# Patient Record
Sex: Female | Born: 1965 | Race: Black or African American | Hispanic: No | Marital: Single | Smoking: Never smoker
Health system: Southern US, Community
[De-identification: ages and names within clinical notes are randomized; demographics above are authoritative.]

## PROBLEM LIST (undated history)

## (undated) DIAGNOSIS — K649 Unspecified hemorrhoids: Secondary | ICD-10-CM

## (undated) DIAGNOSIS — M549 Dorsalgia, unspecified: Secondary | ICD-10-CM

## (undated) DIAGNOSIS — K5900 Constipation, unspecified: Secondary | ICD-10-CM

## (undated) DIAGNOSIS — N83209 Unspecified ovarian cyst, unspecified side: Secondary | ICD-10-CM

## (undated) HISTORY — DX: Unspecified ovarian cyst, unspecified side: N83.209

## (undated) HISTORY — PX: TONSILLECTOMY: SUR1361

## (undated) HISTORY — DX: Constipation, unspecified: K59.00

## (undated) HISTORY — DX: Dorsalgia, unspecified: M54.9

## (undated) HISTORY — DX: Unspecified hemorrhoids: K64.9

---

## 1998-05-28 ENCOUNTER — Other Ambulatory Visit: Admission: RE | Admit: 1998-05-28 | Discharge: 1998-05-28 | Payer: Self-pay | Admitting: Obstetrics and Gynecology

## 2000-08-17 ENCOUNTER — Other Ambulatory Visit: Admission: RE | Admit: 2000-08-17 | Discharge: 2000-08-17 | Payer: Self-pay | Admitting: Family Medicine

## 2001-07-28 ENCOUNTER — Other Ambulatory Visit: Admission: RE | Admit: 2001-07-28 | Discharge: 2001-07-28 | Payer: Self-pay | Admitting: Obstetrics and Gynecology

## 2002-04-23 ENCOUNTER — Emergency Department (HOSPITAL_COMMUNITY): Admission: EM | Admit: 2002-04-23 | Discharge: 2002-04-23 | Payer: Self-pay | Admitting: Emergency Medicine

## 2002-08-01 ENCOUNTER — Other Ambulatory Visit: Admission: RE | Admit: 2002-08-01 | Discharge: 2002-08-01 | Payer: Self-pay | Admitting: Obstetrics and Gynecology

## 2002-08-28 ENCOUNTER — Ambulatory Visit (HOSPITAL_COMMUNITY): Admission: RE | Admit: 2002-08-28 | Discharge: 2002-08-28 | Payer: Self-pay | Admitting: Obstetrics and Gynecology

## 2002-09-11 ENCOUNTER — Encounter: Payer: Self-pay | Admitting: Neurology

## 2002-09-11 ENCOUNTER — Encounter: Admission: RE | Admit: 2002-09-11 | Discharge: 2002-09-11 | Payer: Self-pay | Admitting: Neurology

## 2003-08-28 ENCOUNTER — Other Ambulatory Visit: Admission: RE | Admit: 2003-08-28 | Discharge: 2003-08-28 | Payer: Self-pay | Admitting: Obstetrics and Gynecology

## 2004-09-07 ENCOUNTER — Emergency Department (HOSPITAL_COMMUNITY): Admission: EM | Admit: 2004-09-07 | Discharge: 2004-09-07 | Payer: Self-pay | Admitting: Emergency Medicine

## 2004-09-15 ENCOUNTER — Other Ambulatory Visit: Admission: RE | Admit: 2004-09-15 | Discharge: 2004-09-15 | Payer: Self-pay | Admitting: Obstetrics and Gynecology

## 2005-09-17 ENCOUNTER — Other Ambulatory Visit: Admission: RE | Admit: 2005-09-17 | Discharge: 2005-09-17 | Payer: Self-pay | Admitting: Obstetrics and Gynecology

## 2005-09-19 ENCOUNTER — Emergency Department (HOSPITAL_COMMUNITY): Admission: EM | Admit: 2005-09-19 | Discharge: 2005-09-19 | Payer: Self-pay | Admitting: Family Medicine

## 2006-10-12 ENCOUNTER — Other Ambulatory Visit: Admission: RE | Admit: 2006-10-12 | Discharge: 2006-10-12 | Payer: Self-pay | Admitting: Obstetrics & Gynecology

## 2007-08-02 ENCOUNTER — Encounter: Admission: RE | Admit: 2007-08-02 | Discharge: 2007-08-02 | Payer: Self-pay | Admitting: Obstetrics and Gynecology

## 2007-11-03 ENCOUNTER — Other Ambulatory Visit: Admission: RE | Admit: 2007-11-03 | Discharge: 2007-11-03 | Payer: Self-pay | Admitting: Obstetrics and Gynecology

## 2008-11-05 ENCOUNTER — Other Ambulatory Visit: Admission: RE | Admit: 2008-11-05 | Discharge: 2008-11-05 | Payer: Self-pay | Admitting: Obstetrics and Gynecology

## 2009-06-12 ENCOUNTER — Emergency Department (HOSPITAL_COMMUNITY): Admission: EM | Admit: 2009-06-12 | Discharge: 2009-06-12 | Payer: Self-pay | Admitting: Emergency Medicine

## 2010-09-28 ENCOUNTER — Encounter: Payer: Self-pay | Admitting: Obstetrics and Gynecology

## 2010-12-11 LAB — POCT I-STAT, CHEM 8
Chloride: 105 mEq/L (ref 96–112)
Creatinine, Ser: 0.6 mg/dL (ref 0.4–1.2)
Glucose, Bld: 93 mg/dL (ref 70–99)
HCT: 40 % (ref 36.0–46.0)
Potassium: 4.3 mEq/L (ref 3.5–5.1)
Sodium: 140 mEq/L (ref 135–145)

## 2010-12-11 LAB — DIFFERENTIAL
Basophils Absolute: 0 10*3/uL (ref 0.0–0.1)
Basophils Relative: 0 % (ref 0–1)
Lymphocytes Relative: 23 % (ref 12–46)
Lymphs Abs: 1.7 10*3/uL (ref 0.7–4.0)
Monocytes Relative: 5 % (ref 3–12)
Neutrophils Relative %: 72 % (ref 43–77)

## 2010-12-11 LAB — GC/CHLAMYDIA PROBE AMP, GENITAL
Chlamydia, DNA Probe: NEGATIVE
GC Probe Amp, Genital: NEGATIVE

## 2010-12-11 LAB — CBC
MCHC: 32.4 g/dL (ref 30.0–36.0)
MCV: 90.8 fL (ref 78.0–100.0)
Platelets: 229 10*3/uL (ref 150–400)
RBC: 4.23 MIL/uL (ref 3.87–5.11)
WBC: 7.4 10*3/uL (ref 4.0–10.5)

## 2010-12-11 LAB — URINALYSIS, ROUTINE W REFLEX MICROSCOPIC
Bilirubin Urine: NEGATIVE
Ketones, ur: NEGATIVE mg/dL
Protein, ur: NEGATIVE mg/dL
Urobilinogen, UA: 0.2 mg/dL (ref 0.0–1.0)
pH: 8 (ref 5.0–8.0)

## 2010-12-11 LAB — URINE MICROSCOPIC-ADD ON

## 2010-12-11 LAB — WET PREP, GENITAL: Yeast Wet Prep HPF POC: NONE SEEN

## 2011-01-23 NOTE — Op Note (Signed)
NAME:  Brooke West, Brooke West                   ACCOUNT NO.:  [id number]   MEDICAL RECORD NO.:  [id number]                   PATIENT TYPE:  AMB   LOCATION:  SDC                                  FACILITY:  [facility]   PHYSICIAN:  Cynthia P. Romine, M.D.             DATE OF BIRTH:  1966-04-04   DATE OF PROCEDURE:  08/28/2002  DATE OF DISCHARGE:                                 OPERATIVE REPORT   PREOPERATIVE DIAGNOSES:  Desire attempt at permanent surgical sterilization.   POSTOPERATIVE DIAGNOSES:  Desire attempt at permanent surgical  sterilization.   PROCEDURE:  Falope ring bilateral tubal sterilization procedure.   SURGEON:  Cynthia P. Romine, M.D.   ANESTHESIA:  General endotracheal.   ESTIMATED BLOOD LOSS:  Minimal.   COMPLICATIONS:  None.   PROCEDURE:  The patient was taken to the operating room and after the  induction of adequate general endotracheal anesthesia was placed in a dorsal  lithotomy position and prepped and draped in the usual fashion.  A Hulka  uterine manipulator was placed.  The bladder was catheterized with a red  rubber catheter in-and-out.  A subumbilical incision was made and the Veress  needle was inserted into the peritoneal space.  Proper placement was tested  by noting free flow of saline through the Veress needle with a negative  aspirate and then by noting the response of a drop of saline placed at the  hub of the Veress needle with negative pressure as the abdominal wall was  elevated.  Pneumoperitoneum was created with 3 L of CO2 using the Stryker  automatic insufflator.  A disposable 10-11 mm trocar was inserted into the  peritoneal space and its proper placement noted with the laparoscope.  A  small midline suprapubic incision was made and lower trocar was inserted  under direct visualization.  The pelvis was inspected.  The tubes, ovaries,  and uterus were normal.  The tubes were freely mobile.  The right fallopian  tube was identified and traced  to its fimbriated end.  The isthmic portion  was elevated and the Falope ring was placed.  Good blanching was noted and  no bleeding was encountered.  The procedure repeated on the patient's left,  identifying the tube and tracing it to its fimbriated end.  The mid isthmic  portion was elevated and the Falope ring was placed.  Again, good blanching  was noted and no bleeding was encountered.  Photographic documentation was  taken.  Instruments were then removed from the abdomen.  Pneumoperitoneum  was allowed to escape.  The incisions were closed subcuticularly with 3-0  Vicryl.  Instruments removed from the vagina and the procedure was  terminated.  The patient tolerated it well and went in satisfactory  condition to postanesthesia recovery.  Cynthia P. Romine, M.D.    CPR/MEDQ  D:  08/28/2002  T:  08/28/2002  Job:  [id number]

## 2011-03-27 IMAGING — CR DG ABDOMEN 1V
2 series · 2 of 2 positions shown · non-contrast
Comparison: None available.

CLINICAL DATA: Right lower quadrant pain.

SINGLE VIEW ABDOMEN

[t abdomen supine (1 of 2)]
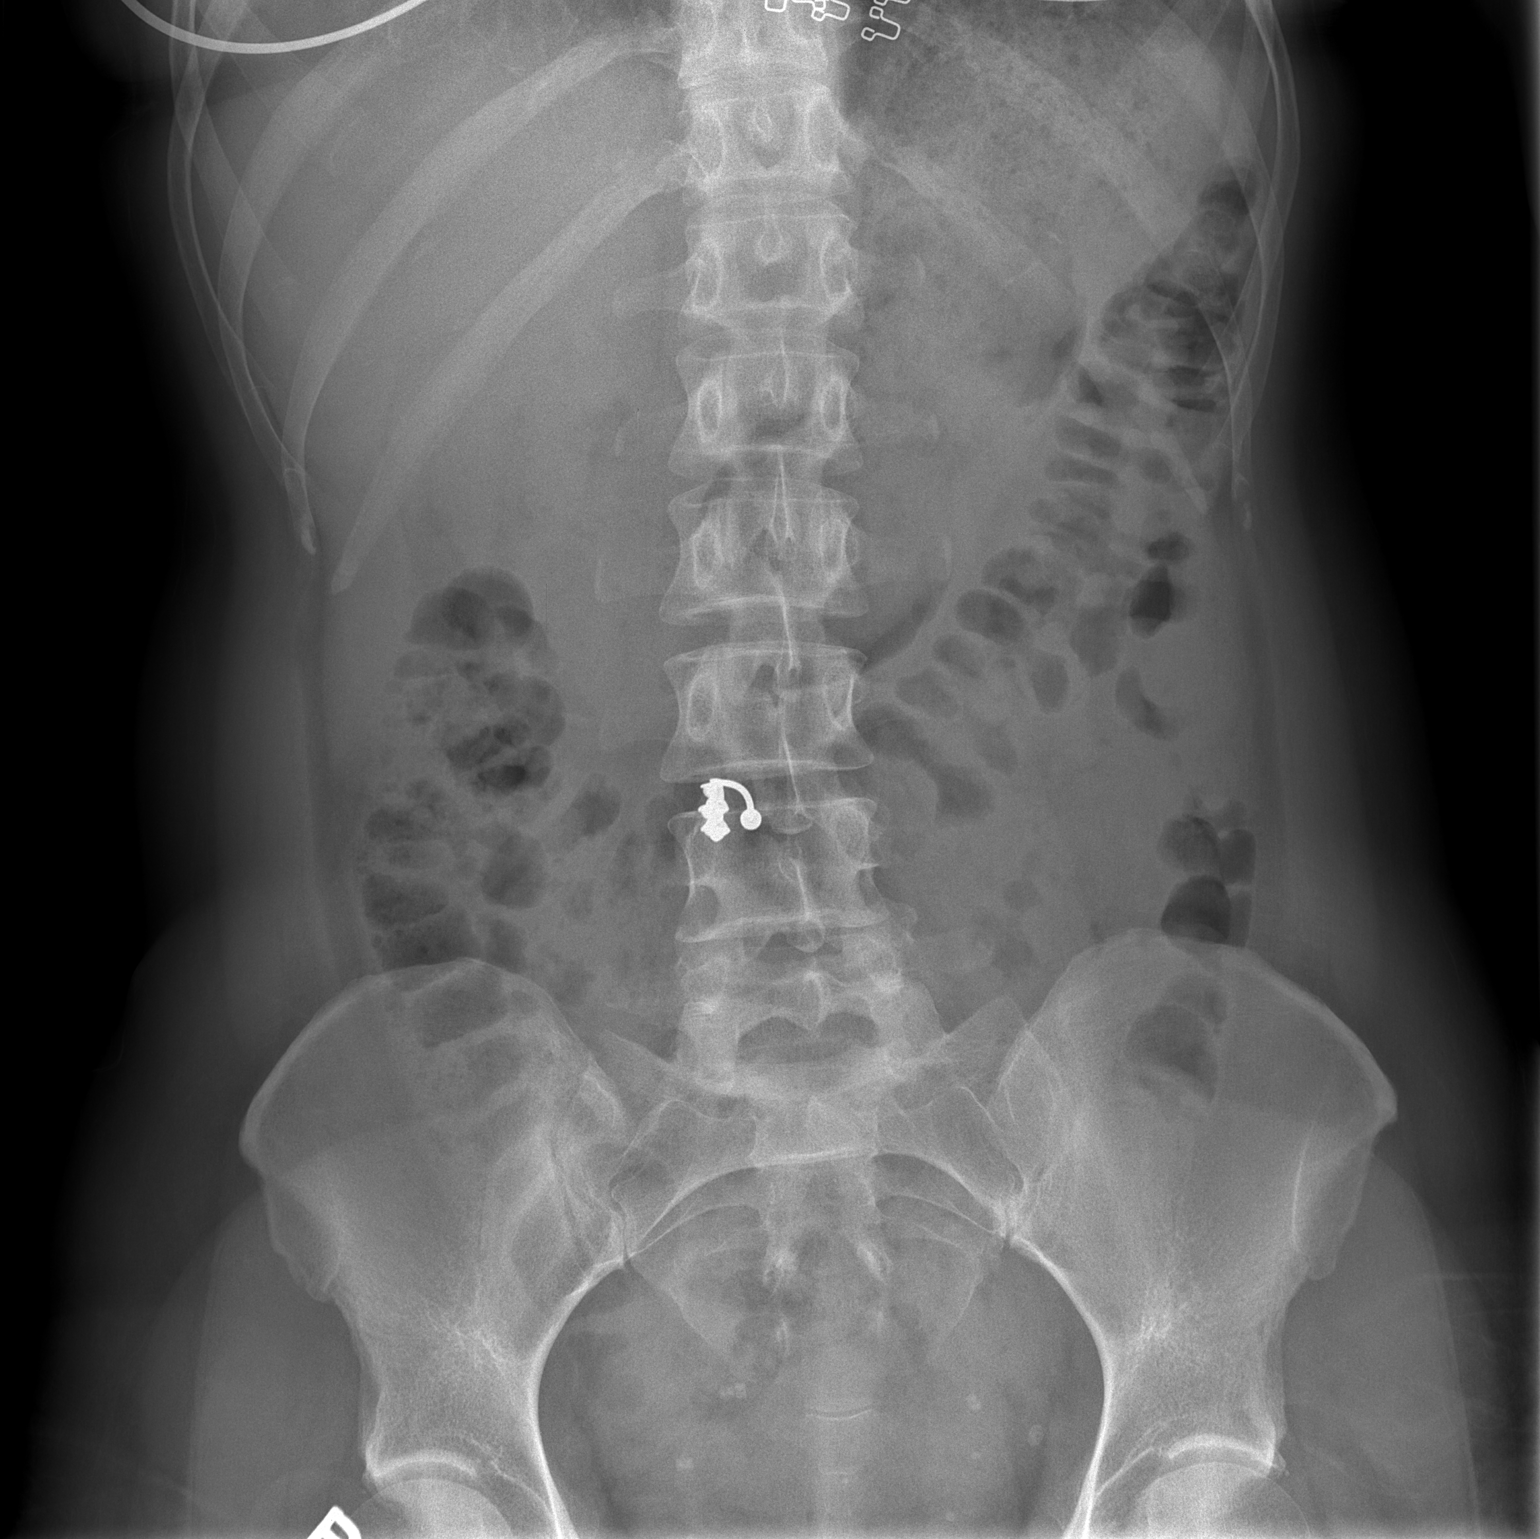

[t abdomen supine (2 of 2)]
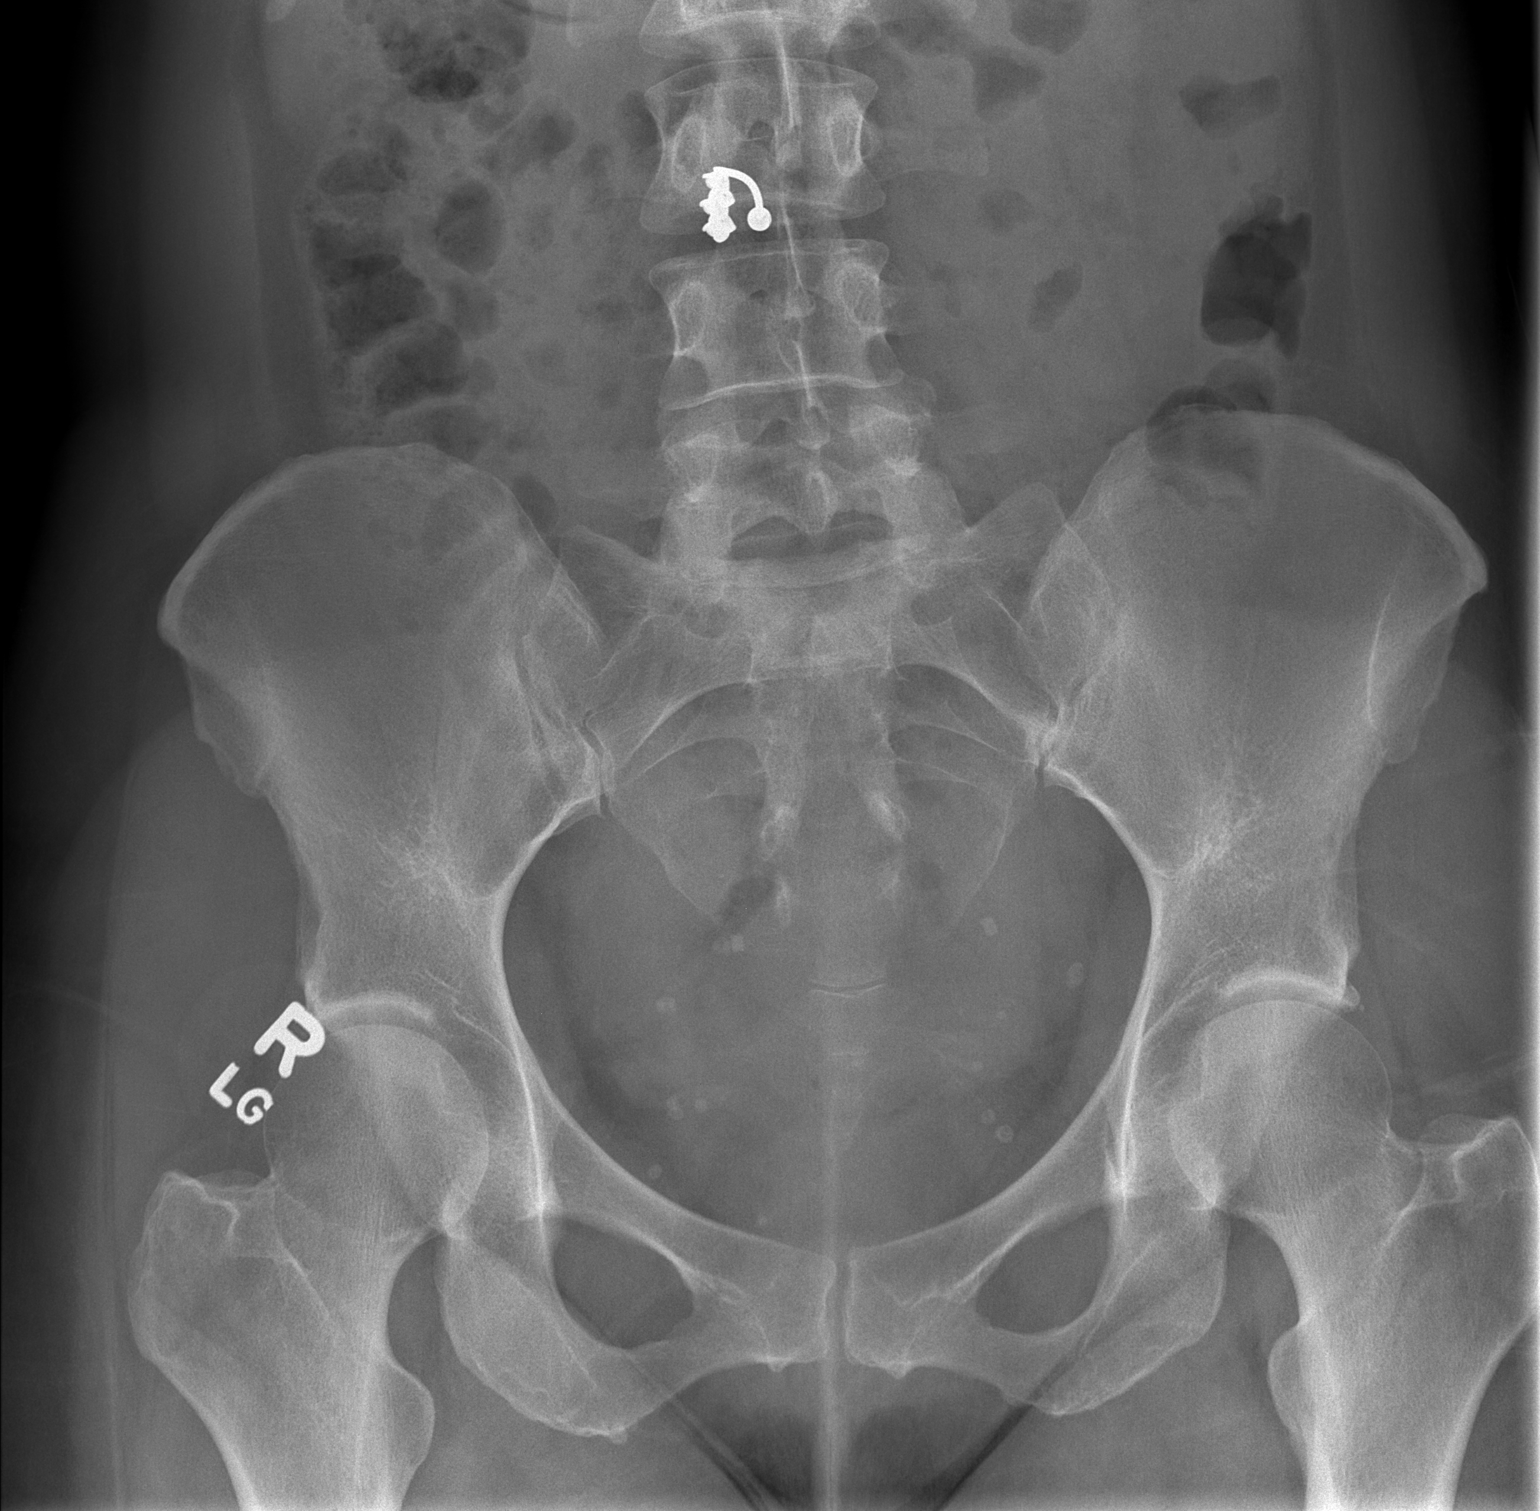

[2 of 2 positions shown; findings below may reference images not displayed]

FINDINGS: The bowel gas pattern is normal.  No unexpected abdominal
calcification.  No focal bony abnormality.
IMPRESSION: No acute finding.

## 2011-07-21 ENCOUNTER — Other Ambulatory Visit: Payer: Self-pay | Admitting: Obstetrics and Gynecology

## 2011-07-21 DIAGNOSIS — N6009 Solitary cyst of unspecified breast: Secondary | ICD-10-CM

## 2011-08-04 ENCOUNTER — Ambulatory Visit
Admission: RE | Admit: 2011-08-04 | Discharge: 2011-08-04 | Disposition: A | Discharge status: Home / Self Care | Payer: BC Managed Care – PPO | Source: Ambulatory Visit | Attending: Obstetrics and Gynecology | Admitting: Obstetrics and Gynecology

## 2011-08-04 DIAGNOSIS — N6009 Solitary cyst of unspecified breast: Secondary | ICD-10-CM

## 2011-09-08 HISTORY — PX: FOOT SURGERY: SHX648

## 2011-11-19 LAB — HM PAP SMEAR: HM PAP: NEGATIVE

## 2012-11-21 ENCOUNTER — Encounter: Payer: Self-pay | Admitting: Nurse Practitioner

## 2012-11-21 ENCOUNTER — Encounter: Payer: Self-pay | Admitting: Cardiovascular Disease

## 2012-11-21 ENCOUNTER — Other Ambulatory Visit: Payer: Self-pay | Admitting: Nurse Practitioner

## 2012-11-28 ENCOUNTER — Telehealth: Payer: Self-pay | Admitting: Orthopedic Surgery

## 2012-11-28 ENCOUNTER — Encounter: Payer: Self-pay | Admitting: Cardiovascular Disease

## 2012-11-28 ENCOUNTER — Ambulatory Visit (INDEPENDENT_AMBULATORY_CARE_PROVIDER_SITE_OTHER): Payer: Federal, State, Local not specified - PPO | Admitting: Cardiovascular Disease

## 2012-11-28 VITALS — BP 142/98 | HR 67 | Ht 71.0 in | Wt 193.8 lb

## 2012-11-28 DIAGNOSIS — R002 Palpitations: Secondary | ICD-10-CM | POA: Insufficient documentation

## 2012-11-28 NOTE — Progress Notes (Signed)
   History of Present Illness: 47 yo female with no prior cardiac history who is referred today for evaluation of palpitations. She tells me that she feels a flutter in her chest pain that occurs several times per week. She mostly notices these at rest. This happened yesterday while in a hot bath. She does feel nausea occasionally when she notices the palpitations. No chest pain. The fluttering is worsened with caffeine intake. No prior cardiac issues. TSH normal.   Primary Care Physician: [facility] Shirlyn Goltz, [place])   Past Medical History  Diagnosis Date  . Back pain   . Constipation   . Hemorrhoids   . Ovarian cyst     Past Surgical History  Procedure Laterality Date  . Foot surgery  2013    Current Outpatient Prescriptions  Medication Sig Dispense Refill  . ibuprofen (ADVIL,MOTRIN) 800 MG tablet Take 800 mg by mouth every 8 (eight) hours as needed for pain.      . valACYclovir (VALTREX) 1000 MG tablet As directed       No current facility-administered medications for this visit.    No Known Allergies  History   Social History  . Marital Status: Married    Spouse Name: N/A    Number of Children: 1  . Years of Education: N/A   Occupational History  .  [employer]   Social History Main Topics  . Smoking status: Former Games developer  . Smokeless tobacco: Not on file  . Alcohol Use: 0.5 oz/week    1 drink(s) per week  . Drug Use: No  . Sexually Active: Not on file   Other Topics Concern  . Not on file   Social History Narrative  . No narrative on file    Family History  Problem Relation Age of Onset  . Heart attack Maternal Grandfather     Review of Systems:  As stated in the HPI and otherwise negative.   BP 142/98  Pulse 67  Ht 5\' 11"  (1.803 m)  Wt 193 lb 12.8 oz (87.907 kg)  BMI 27.04 kg/m2  Physical Examination: General: Well developed, well nourished, NAD HEENT: OP clear, mucus membranes moist SKIN: warm, dry. No rashes. Neuro:  No focal deficits Musculoskeletal: Muscle strength 5/5 all ext Psychiatric: Mood and affect normal Neck: No JVD, no carotid bruits, no thyromegaly, no lymphadenopathy. Lungs:Clear bilaterally, no wheezes, rhonci, crackles Cardiovascular: Regular rate and rhythm. No murmurs, gallops or rubs. Abdomen:Soft. Bowel sounds present. Non-tender.  Extremities: No lower extremity edema. Pulses are 2 + in the bilateral DP/PT.  EKG: NSR, rate 67 bpm.   Assessment and Plan:   1. Palpitations: She has symptoms c/w premature beats. TSH is normal. EKG is normal. Will arrange 48 hour monitor and echocardiogram. She is asked to avoid stimulants such as caffeine, nicotine, OTC meds with stimulant qualities.

## 2012-11-28 NOTE — Telephone Encounter (Signed)
Spoke with pt about lab results Vit D, cholesterol, CMP, TSH. Instructed to take Vit D 50,000 IU weekly and RTO in 8 weeks for recheck of Vit D level. Pt agreeable. Pharmacy info received. Pt also planning to see MD at [facility] this afternoon. Appt at 3:45.

## 2012-11-28 NOTE — Patient Instructions (Signed)
Your physician recommends that you schedule a follow-up appointment in:  4 weeks.   Your physician has requested that you have an echocardiogram. Echocardiography is a painless test that uses sound waves to create images of your heart. It provides your doctor with information about the size and shape of your heart and how well your heart's chambers and valves are working. This procedure takes approximately one hour. There are no restrictions for this procedure.   Your physician has recommended that you wear a holter monitor. Holter monitors are medical devices that record the heart's electrical activity. Doctors most often use these monitors to diagnose arrhythmias. Arrhythmias are problems with the speed or rhythm of the heartbeat. The monitor is a small, portable device. You can wear one while you do your normal daily activities. This is usually used to diagnose what is causing palpitations/syncope (passing out).  Marland Kitchen

## 2012-12-02 ENCOUNTER — Other Ambulatory Visit (HOSPITAL_COMMUNITY): Payer: Federal, State, Local not specified - PPO

## 2013-01-03 ENCOUNTER — Ambulatory Visit: Payer: Federal, State, Local not specified - PPO | Admitting: Cardiovascular Disease

## 2013-01-03 ENCOUNTER — Other Ambulatory Visit: Payer: Self-pay | Admitting: Nurse Practitioner

## 2013-01-03 MED ORDER — VALACYCLOVIR HCL 1 G PO TABS: 1000.0000 mg | ORAL_TABLET | Freq: Every day | ORAL | Status: DC

## 2013-01-03 NOTE — Telephone Encounter (Signed)
Valtrex 1000mg  #90/2 refills approved per protocol. Pt is aware. (aex in 12/2013)

## 2013-01-04 ENCOUNTER — Ambulatory Visit (INDEPENDENT_AMBULATORY_CARE_PROVIDER_SITE_OTHER): Payer: Federal, State, Local not specified - PPO | Admitting: *Deleted

## 2013-01-04 ENCOUNTER — Ambulatory Visit (HOSPITAL_COMMUNITY): Payer: Federal, State, Local not specified - PPO | Attending: Cardiovascular Disease | Admitting: Radiology

## 2013-01-04 DIAGNOSIS — R002 Palpitations: Secondary | ICD-10-CM

## 2013-01-04 DIAGNOSIS — Z87891 Personal history of nicotine dependence: Secondary | ICD-10-CM | POA: Insufficient documentation

## 2013-01-04 NOTE — Progress Notes (Signed)
Echocardiogram performed.  

## 2013-01-04 NOTE — Progress Notes (Signed)
2 Day e-cardio MCT placed on patient as 48 hour Holter.

## 2013-01-19 ENCOUNTER — Telehealth: Payer: Self-pay | Admitting: Cardiovascular Disease

## 2013-01-19 NOTE — Telephone Encounter (Signed)
Left message to call back  

## 2013-01-19 NOTE — Telephone Encounter (Signed)
New Prob      Pt calling in following up on monitor results. Please call.

## 2013-01-20 NOTE — Telephone Encounter (Signed)
Spoke with pt and reviewed monitor results with her.  

## 2013-08-28 ENCOUNTER — Encounter: Payer: Self-pay | Admitting: Nurse Practitioner

## 2013-10-27 ENCOUNTER — Other Ambulatory Visit: Payer: Self-pay | Admitting: Nurse Practitioner

## 2013-10-27 NOTE — Telephone Encounter (Signed)
Last AEX and refill 11/21/2012 68mons/3R Next AEX 11/28/2013  Will refill for 1 month until next appt.

## 2013-11-23 ENCOUNTER — Ambulatory Visit: Payer: Self-pay | Admitting: Nurse Practitioner

## 2013-11-23 ENCOUNTER — Other Ambulatory Visit: Payer: Self-pay | Admitting: Nurse Practitioner

## 2013-11-23 NOTE — Telephone Encounter (Signed)
Lastt refill 10/27/13 #28/0 refills Next appt 11/28/2013.  Will refill once until next appt.

## 2013-11-28 ENCOUNTER — Ambulatory Visit: Payer: Self-pay | Admitting: Nurse Practitioner

## 2013-12-22 ENCOUNTER — Other Ambulatory Visit: Payer: Self-pay | Admitting: Nurse Practitioner

## 2013-12-22 NOTE — Telephone Encounter (Signed)
Last AEX 11/21/2012 Last refill 11/23/13 Next appt 01/09/14  Will refill once. - Last time. - Until appt 01/09/14.

## 2014-01-09 ENCOUNTER — Encounter: Payer: Self-pay | Admitting: Nurse Practitioner

## 2014-01-09 ENCOUNTER — Ambulatory Visit (INDEPENDENT_AMBULATORY_CARE_PROVIDER_SITE_OTHER): Payer: Federal, State, Local not specified - PPO | Admitting: Nurse Practitioner

## 2014-01-09 ENCOUNTER — Other Ambulatory Visit: Payer: Self-pay | Admitting: Nurse Practitioner

## 2014-01-09 VITALS — BP 120/84 | HR 64 | Wt 206.8 lb

## 2014-01-09 DIAGNOSIS — N9489 Other specified conditions associated with female genital organs and menstrual cycle: Secondary | ICD-10-CM

## 2014-01-09 DIAGNOSIS — Z0000 Encounter for general adult medical examination without abnormal findings: Secondary | ICD-10-CM

## 2014-01-09 DIAGNOSIS — Z01419 Encounter for gynecological examination (general) (routine) without abnormal findings: Secondary | ICD-10-CM

## 2014-01-09 LAB — POCT URINALYSIS DIPSTICK
BILIRUBIN UA: NEGATIVE
Glucose, UA: NEGATIVE
Ketones, UA: NEGATIVE
Nitrite, UA: NEGATIVE
PH UA: 5
Protein, UA: NEGATIVE
RBC UA: NEGATIVE
Urobilinogen, UA: NEGATIVE

## 2014-01-09 MED ORDER — VALACYCLOVIR HCL 1 G PO TABS: 1000.0000 mg | ORAL_TABLET | Freq: Every day | ORAL | Status: DC

## 2014-01-09 MED ORDER — IBUPROFEN 800 MG PO TABS: 800.0000 mg | ORAL_TABLET | Freq: Three times a day (TID) | ORAL | Status: DC | PRN

## 2014-01-09 MED ORDER — NORETHIN-ETH ESTRAD TRIPHASIC 0.5/0.75/1-35 MG-MCG PO TABS: ORAL_TABLET | ORAL | Status: DC

## 2014-01-09 MED ORDER — NITROFURANTOIN MONOHYD MACRO 100 MG PO CAPS: 100.0000 mg | ORAL_CAPSULE | Freq: Two times a day (BID) | ORAL | Status: DC

## 2014-01-09 NOTE — Progress Notes (Signed)
Patient ID: Brooke West, female   DOB: 1966-06-09, 48 y.o.   MRN: [id number] 48 y.o. G2P1A1  African American Fe here for annual exam.  Menses is reguar and at times heavier.  Not SA X 2 years. She is having more discomfort with her cycles and lower pelvic pressure.  Has history of uterine fibroids and is concerned that this may the cause.  Last PUS done 2008.  She has moved to [place], [place] with her job.  Patient's last menstrual period was 12/19/2013.          Sexually active: no  The current method of family planning is OCP (estrogen/progesterone).    Exercising: yes  Gym/ health club routine includes cardio and light to medium weights 3 times per week. Smoker:  no  Health Maintenance: Pap:  11/19/11, WNL, neg HR HPV MMG:  08/04/11, Bi-Rads 2: benign findings repeat yearly TDaP:  11/21/12 Labs:  HB:  12.8 Urine:  Trace leuks   reports that she has never smoked. She has never used smokeless tobacco. She reports that she drinks about .5 ounces of alcohol per week. She reports that she does not use illicit drugs.  Past Medical History  Diagnosis Date  . Back pain   . Constipation   . Hemorrhoids   . Ovarian cyst     Past Surgical History  Procedure Laterality Date  . 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 Tonsillectomy  as child    Current Outpatient Prescriptions  Medication Sig Dispense Refill  . ibuprofen (ADVIL,MOTRIN) 800 MG tablet Take 1 tablet (800 mg total) by mouth every 8 (eight) hours as needed.  30 tablet  12  . norethindrone-ethinyl estradiol (NECON 7/7/7) 0.5/0.75/1-35 MG-MCG tablet TAKE 1 TABLET BY MOUTH EVERY DAY  3 Package  3  . valACYclovir (VALTREX) 1000 MG tablet Take 1 tablet (1,000 mg total) by mouth daily.  90 tablet  3  . nitrofurantoin, macrocrystal-monohydrate, (MACROBID) 100 MG capsule Take 1 capsule (100 mg total) by mouth 2 (two) times daily.  14 capsule  0   No current facility-administered  medications for this visit.    Family History  Problem Relation Age of Onset  . Heart attack Maternal Grandfather     ROS:  Pertinent items are noted in HPI.  Otherwise, a comprehensive ROS was negative.  Exam:   BP 120/84  Pulse 64  Wt 206 lb 12.8 oz (93.804 kg)  LMP 12/19/2013    Ht Readings from Last 3 Encounters:  11/28/12 5\' 11"  (1.803 m)    General appearance: alert, cooperative and appears stated age Head: Normocephalic, without obvious abnormality, atraumatic Neck: no adenopathy, supple, symmetrical, trachea midline and thyroid normal to inspection and palpation Lungs: clear to auscultation bilaterally Breasts: normal appearance, no masses or tenderness Heart: regular rate and rhythm Abdomen: soft, non-tender; no masses,  no organomegaly Extremities: extremities normal, atraumatic, no cyanosis or edema Skin: Skin color, texture, turgor normal. No rashes or lesions Lymph nodes: Cervical, supraclavicular, and axillary nodes normal. No abnormal inguinal nodes palpated Neurologic: Grossly normal   Pelvic: External genitalia:  no lesions              Urethra:  normal appearing urethra with no masses, tenderness or lesions              Bartholin's and Skene's: normal  Vagina: normal appearing vagina with normal color and discharge, no lesions              Cervix: anteverted              Pap taken: no Bimanual Exam:  Uterus:  enlarged, 6 weeks size              Adnexa: no mass, fullness, tenderness               Rectovaginal: Confirms               Anus:  normal sphincter tone, no lesions  A:  Well Woman with normal exam  History of uterine fibroids with recent pelvic pressure and pain with menses  History of HSV  P:   Reviewed health and wellness pertinent to exam  Pap smear not taken today  Mammogram is due now  Refill Valtrex prn for a year  Will get PUS/ SHGM and proceed to evaluate her pain  Counseled on breast self exam, mammography  screening, adequate intake of calcium and vitamin D, diet and exercise, Kegel's exercises return annually or prn  An After Visit Summary was printed and given to the patient.

## 2014-01-09 NOTE — Patient Instructions (Signed)

## 2014-01-10 ENCOUNTER — Telehealth: Payer: Self-pay | Admitting: Nurse Practitioner

## 2014-01-10 LAB — HEMOGLOBIN, FINGERSTICK: HEMOGLOBIN, FINGERSTICK: 12.8 g/dL (ref 12.0–16.0)

## 2014-01-10 NOTE — Telephone Encounter (Signed)
per gentleman who answered, this is the wrong #

## 2014-01-14 NOTE — Progress Notes (Signed)
Encounter reviewed by Dr. Sydna Brodowski Silva.  

## 2014-01-16 NOTE — Telephone Encounter (Signed)
Left message for patient to call back. Need to go over benefits and schedule SHGM. °

## 2014-02-02 NOTE — Telephone Encounter (Signed)
Spoke with patient. Advised that per benefit quote received, patient liability for PUS will be $215. Patient states that she will call back to schedule.

## 2014-02-02 NOTE — Telephone Encounter (Signed)
No answer at the telephone # on file for the patient.  Emailed patient: From: Felipa Emory  Sent: Friday, Feb 02, 2014 12:16 PM To: '[email or web address]' Subject: contact info  Good afternoon,  Please call our office.  [phone].  Thank you, Dewayne Hatch. [place]

## 2014-07-09 ENCOUNTER — Encounter: Payer: Self-pay | Admitting: Nurse Practitioner

## 2014-12-20 ENCOUNTER — Other Ambulatory Visit: Payer: Self-pay | Admitting: Nurse Practitioner

## 2014-12-21 NOTE — Telephone Encounter (Signed)
No refill of OCPs at this time.  Did patient have her mammogram somewhere in the last year? We do not have documentation of this.

## 2014-12-21 NOTE — Telephone Encounter (Signed)
Medication refill request: OCP Last AEX:  01/09/14 PG Next AEX: 01/14/15 PG Last MMG (if hormonal medication request): 08/04/11 BIRADS2:Bening  Refill authorized: 01/09/14 #3packs/3R. Today #1pack/0R?  Routed to Dr. Quincy Simmonds

## 2014-12-21 NOTE — Telephone Encounter (Signed)
Left voicemail for pt to call back Monday.

## 2014-12-22 ENCOUNTER — Other Ambulatory Visit: Payer: Self-pay | Admitting: Nurse Practitioner

## 2014-12-24 ENCOUNTER — Other Ambulatory Visit: Payer: Self-pay | Admitting: Nurse Practitioner

## 2014-12-24 NOTE — Telephone Encounter (Signed)
Spoke with patient. Advised patient of information of location for mammogram in [place], [place]. Patient is agreeable. Offered to schedule appointment for patient but patient declines. Will call to schedule appointment on her own. 1 month supply for [employer] sent to [facility] in [place]. Patient is agreeable. Advised will need to have result report sent to our office for review and further refills at annual exam. Patient is agreeable.  Routing to provider for final review. Patient agreeable to disposition. Will close encounter

## 2014-12-24 NOTE — Telephone Encounter (Signed)
Olivia Mackie was able to find this information:   [place] [facility] [employer] [phone]

## 2014-12-24 NOTE — Telephone Encounter (Signed)
Spoke with patient regarding refill request denial below. Patient states "I have moved out of town to [place] and I have not switched my providers yet. I am coming back for my yearly on 5/9 to see Patty. Can I not get a month refill until I see her and can schedule a mammogram?" Advised patient needs to have recent mammogram when taking OCP due to health risks. "I do not know of any where to get it done. If she knows somewhere in [place] I could go I will. I could also get it done on the same day I am there for that appointment if I need to." Advised patient will speak with provider to see if she is familiar with a facility within the [place] area. If not patient would like me to schedule mammogram in [facility] for 5/9.

## 2014-12-24 NOTE — Telephone Encounter (Signed)
Elroy Channel, CMA at 12/21/2014 4:45 PM     Status: Signed       Expand All Collapse All   Left voicemail for pt to call back Monday.             Nunzio Cobbs, MD at 12/21/2014 4:40 PM     Status: Signed       Expand All Collapse All   No refill of OCPs at this time.  Did patient have her mammogram somewhere in the last year? We do not have documentation of this.            Elroy Channel, CMA at 12/21/2014 9:19 AM     Status: Signed       Expand All Collapse All   Medication refill request: OCP Last AEX: 01/09/14 PG Next AEX: 01/14/15 PG Last MMG (if hormonal medication request): 08/04/11 BIRADS2:Bening  Refill authorized: 01/09/14 #3packs/3R. Today #1pack/0R?  Routed to Dr. Quincy Simmonds

## 2014-12-25 NOTE — Telephone Encounter (Signed)
Jasmine Awe, RN at 12/24/2014 4:45 PM     Status: Signed       Expand All Collapse All   Spoke with patient. Advised patient of information of location for mammogram in [place], [place]. Patient is agreeable. Offered to schedule appointment for patient but patient declines. Will call to schedule appointment on her own. 1 month supply for [employer] sent to [facility] in [place]. Patient is agreeable. Advised will need to have result report sent to our office for review and further refills at annual exam. Patient is agreeable.  Routing to provider for final review. Patient agreeable to disposition. Will close encounter

## 2014-12-25 NOTE — Telephone Encounter (Signed)
Patient calling in regards to phone note seen below. Please see refill encounter from 4/16. Patient has scheduled mammogram at [facility]. Calling to see where last mammogram was performed. Advised was last seen at [facility] on 08/04/2011. Patient is agreeable and will have films sent to new location in [place].  Routing to provider for final review. Patient agreeable to disposition. Will close encounter

## 2015-01-14 ENCOUNTER — Telehealth: Payer: Self-pay | Admitting: Nurse Practitioner

## 2015-01-14 ENCOUNTER — Ambulatory Visit: Payer: Self-pay | Admitting: Nurse Practitioner

## 2015-01-14 ENCOUNTER — Other Ambulatory Visit: Payer: Self-pay | Admitting: Nurse Practitioner

## 2015-01-14 MED ORDER — NORETHIN-ETH ESTRAD TRIPHASIC 0.5/0.75/1-35 MG-MCG PO TABS: 1.0000 | ORAL_TABLET | Freq: Every day | ORAL | Status: DC

## 2015-01-14 NOTE — Telephone Encounter (Signed)
Provider cancelled aex due to illness on 01/14/15. Pt upset about rescheduling. Pt rescheduled for 02/05/15. Pt requests pack of pills until rescheduled appointment.   [facility] in [place]

## 2015-01-14 NOTE — Telephone Encounter (Signed)
Medication refill request: [place] Last AEX:  01/09/14 with PG Next AEX: 02/05/15 with PG  Last MMG (if hormonal medication request): 08/04/11 Bi-Rads 2: benign Refill authorized:#1 pack/0 rfs, please advise.  (routed to DL since PG is out of office).

## 2015-01-14 NOTE — Telephone Encounter (Signed)
Patient notified that rx has been sent in. 

## 2015-01-14 NOTE — Telephone Encounter (Signed)
Left message on voicemail to call and reschedule cancelled appointment. °

## 2015-02-05 ENCOUNTER — Encounter: Payer: Self-pay | Admitting: Nurse Practitioner

## 2015-02-05 ENCOUNTER — Ambulatory Visit (INDEPENDENT_AMBULATORY_CARE_PROVIDER_SITE_OTHER): Payer: Federal, State, Local not specified - PPO | Admitting: Nurse Practitioner

## 2015-02-05 VITALS — BP 124/82 | HR 68 | Ht 70.5 in | Wt 202.0 lb

## 2015-02-05 DIAGNOSIS — Z01419 Encounter for gynecological examination (general) (routine) without abnormal findings: Secondary | ICD-10-CM | POA: Diagnosis not present

## 2015-02-05 DIAGNOSIS — Z0000 Encounter for general adult medical examination without abnormal findings: Secondary | ICD-10-CM | POA: Diagnosis not present

## 2015-02-05 DIAGNOSIS — D259 Leiomyoma of uterus, unspecified: Secondary | ICD-10-CM | POA: Diagnosis not present

## 2015-02-05 LAB — COMPREHENSIVE METABOLIC PANEL
ALBUMIN: 3.6 g/dL (ref 3.5–5.2)
ALT: 12 U/L (ref 0–35)
AST: 14 U/L (ref 0–37)
Alkaline Phosphatase: 37 U/L — ABNORMAL LOW (ref 39–117)
BUN: 8 mg/dL (ref 6–23)
CHLORIDE: 103 meq/L (ref 96–112)
CO2: 25 meq/L (ref 19–32)
Calcium: 8.4 mg/dL (ref 8.4–10.5)
Creat: 0.75 mg/dL (ref 0.50–1.10)
Glucose, Bld: 78 mg/dL (ref 70–99)
Potassium: 4.2 mEq/L (ref 3.5–5.3)
Sodium: 136 mEq/L (ref 135–145)
Total Bilirubin: 0.4 mg/dL (ref 0.2–1.2)
Total Protein: 7.4 g/dL (ref 6.0–8.3)

## 2015-02-05 LAB — POCT URINALYSIS DIPSTICK
Bilirubin, UA: NEGATIVE
Glucose, UA: NEGATIVE
KETONES UA: NEGATIVE
Leukocytes, UA: NEGATIVE
Nitrite, UA: NEGATIVE
PH UA: 7
PROTEIN UA: NEGATIVE
RBC UA: NEGATIVE
UROBILINOGEN UA: NEGATIVE

## 2015-02-05 LAB — TSH: TSH: 2.298 u[IU]/mL (ref 0.350–4.500)

## 2015-02-05 LAB — CBC WITH DIFFERENTIAL/PLATELET
BASOS ABS: 0 10*3/uL (ref 0.0–0.1)
BASOS PCT: 0 % (ref 0–1)
Eosinophils Absolute: 0.1 10*3/uL (ref 0.0–0.7)
Eosinophils Relative: 1 % (ref 0–5)
HCT: 42.1 % (ref 36.0–46.0)
Hemoglobin: 13.6 g/dL (ref 12.0–15.0)
Lymphocytes Relative: 39 % (ref 12–46)
Lymphs Abs: 2.3 10*3/uL (ref 0.7–4.0)
MCH: 27.7 pg (ref 26.0–34.0)
MCHC: 32.3 g/dL (ref 30.0–36.0)
MCV: 85.7 fL (ref 78.0–100.0)
MPV: 10.1 fL (ref 8.6–12.4)
Monocytes Absolute: 0.3 10*3/uL (ref 0.1–1.0)
Monocytes Relative: 6 % (ref 3–12)
NEUTROS ABS: 3.1 10*3/uL (ref 1.7–7.7)
Neutrophils Relative %: 54 % (ref 43–77)
Platelets: 266 10*3/uL (ref 150–400)
RBC: 4.91 MIL/uL (ref 3.87–5.11)
RDW: 15 % (ref 11.5–15.5)
WBC: 5.8 10*3/uL (ref 4.0–10.5)

## 2015-02-05 LAB — LIPID PANEL
CHOL/HDL RATIO: 2.9 ratio
Cholesterol: 199 mg/dL (ref 0–200)
HDL: 68 mg/dL (ref >=46)
LDL Cholesterol: 110 mg/dL — ABNORMAL HIGH (ref 0–99)
TRIGLYCERIDES: 107 mg/dL (ref <150)
VLDL: 21 mg/dL (ref 0–40)

## 2015-02-05 MED ORDER — NORETHIN-ETH ESTRAD TRIPHASIC 0.5/0.75/1-35 MG-MCG PO TABS: 1.0000 | ORAL_TABLET | Freq: Every day | ORAL | Status: DC

## 2015-02-05 MED ORDER — VALACYCLOVIR HCL 1 G PO TABS: 1000.0000 mg | ORAL_TABLET | Freq: Every day | ORAL | Status: DC

## 2015-02-05 NOTE — Patient Instructions (Signed)

## 2015-02-05 NOTE — Progress Notes (Signed)
Patient ID: Brooke West, female   DOB: February 19, 1966, 49 y.o.   MRN: [id number] 49 y.o. G2P1011 Single  African American Fe here for annual exam.  Menses is normally 5-6 days.  Flow is somewhat heavier except for the last 2 months.  Still has some pelvic pressure and pain that radiates to the right side - sometimes into the thigh.  History of fibroids uterus and wanted to have PUS done last year - wants to proceed with that now.  She feels tired before the cycle and during.  She has increased her workout routine and finds that she is getting a lot of heart palpitations and extreme fatigue during the work out making her stop earlier than expected.  She has seen cardiologist in town for palpitations and wore a heart monitor.  Results were negative. Not dating or SA in several years.  She is still working in [place] but her job is very stressful and does not like the job. Her sister lives there so that is some comfort for her.  Patient's last menstrual period was 01/08/2015 (exact date).          Sexually active: No.  The current method of family planning is tubal ligation and OCP (estrogen/progesterone).    Exercising: Yes.    cardio and weights 2-3 times per week Smoker:  no  Health Maintenance: Pap:  11/19/11, negative with neg HR HPV MMG:  12/27/14 normal Birads 1 TDaP:  11/21/12 Labs: as ordered Urine:  negative   reports that she has never smoked. She has never used smokeless tobacco. She reports that she drinks about 0.5 oz of alcohol per week. She reports that she does not use illicit drugs.  Past Medical History  Diagnosis Date  . Back pain   . Constipation   . Hemorrhoids   . Ovarian cyst     Past Surgical History  Procedure Laterality Date  . 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 Tonsillectomy  as child    Current Outpatient Prescriptions  Medication Sig Dispense Refill  . ibuprofen (ADVIL,MOTRIN) 800 MG tablet Take 1 tablet  (800 mg total) by mouth every 8 (eight) hours as needed. 30 tablet 12  . norethindrone-ethinyl estradiol (NECON 7/7/7) 0.5/0.75/1-35 MG-MCG tablet Take 1 tablet by mouth daily. 3 Package 4  . valACYclovir (VALTREX) 1000 MG tablet Take 1 tablet (1,000 mg total) by mouth daily. 90 tablet 3   No current facility-administered medications for this visit.    Family History  Problem Relation Age of Onset  . Heart attack Maternal Grandfather     ROS:  Pertinent items are noted in HPI.  Otherwise, a comprehensive ROS was negative.  Exam:   BP 124/82 mmHg  Pulse 68  Ht 5' 10.5" (1.791 m)  Wt 202 lb (91.627 kg)  BMI 28.56 kg/m2  LMP 01/08/2015 (Exact Date) Height: 5' 10.5" (179.1 cm) Ht Readings from Last 3 Encounters:  02/05/15 5' 10.5" (1.791 m)  11/28/12 5\' 11"  (1.803 m)    General appearance: alert, cooperative and appears stated age Head: Normocephalic, without obvious abnormality, atraumatic Neck: no adenopathy, supple, symmetrical, trachea midline and thyroid normal to inspection and palpation Lungs: clear to auscultation bilaterally Breasts: normal appearance, no masses or tenderness Heart: regular rate and rhythm Abdomen: soft, non-tender; no masses,  no organomegaly Extremities: extremities normal, atraumatic, no cyanosis or edema Skin: Skin color, texture, turgor normal. No rashes or  lesions Lymph nodes: Cervical, supraclavicular, and axillary nodes normal. No abnormal inguinal nodes palpated Neurologic: Grossly normal   Pelvic: External genitalia:  no lesions              Urethra:  normal appearing urethra with no masses, tenderness or lesions              Bartholin's and Skene's: normal                 Vagina: normal appearing vagina with normal color and discharge, no lesions              Cervix: anteverted              Pap taken: Yes.   Bimanual Exam:  Uterus:  normal size, contour, position, consistency, mobility, non-tender              Adnexa: no mass, fullness,  tenderness               Rectovaginal: Confirms               Anus:  normal sphincter tone, no lesions  Chaperone present:  yes  A:  Well Woman with normal exam  History of uterine fibroids with pelvic pressure and dysmenorrhea History of HSV  Fatigue and palpitations with work out  P:   Reviewed health and wellness pertinent to exam  Pap smear as above  Mammogram is due 12/2015  Refill ON 777 for a year  Refill  Valtrex for a year  Will reschedule PUS and follow  Will get routine labs and follow to rule other causes of weight gain and fatigue  She will follow with the cardiologist for continued palpitations.  Counseled on breast self exam, mammography screening, use and side effects of OCP's, adequate intake of calcium and vitamin D, diet and exercise return annually or prn  An After Visit Summary was printed and given to the patient.

## 2015-02-06 ENCOUNTER — Telehealth: Payer: Self-pay | Admitting: Nurse Practitioner

## 2015-02-06 LAB — VITAMIN D 25 HYDROXY (VIT D DEFICIENCY, FRACTURES): VIT D 25 HYDROXY: 22 ng/mL — AB (ref 30–100)

## 2015-02-06 NOTE — Telephone Encounter (Signed)
Left message for patient to call back. Need to go over benefits for [facility].

## 2015-02-07 LAB — IPS PAP TEST WITH HPV

## 2015-02-08 NOTE — Telephone Encounter (Signed)
Spoke with patient. Advised of benefit quote received for [facility].  Patient agreeable. Patient will call back to schedule next week.

## 2015-02-09 NOTE — Progress Notes (Signed)
Encounter reviewed by Dr. Brook Silva.  

## 2015-02-22 ENCOUNTER — Other Ambulatory Visit: Payer: Self-pay | Admitting: Obstetrics and Gynecology

## 2015-02-22 DIAGNOSIS — D259 Leiomyoma of uterus, unspecified: Secondary | ICD-10-CM

## 2015-03-07 ENCOUNTER — Other Ambulatory Visit: Payer: Self-pay | Admitting: Obstetrics and Gynecology

## 2015-03-07 ENCOUNTER — Ambulatory Visit (INDEPENDENT_AMBULATORY_CARE_PROVIDER_SITE_OTHER): Payer: Federal, State, Local not specified - PPO

## 2015-03-07 ENCOUNTER — Ambulatory Visit (INDEPENDENT_AMBULATORY_CARE_PROVIDER_SITE_OTHER): Payer: Federal, State, Local not specified - PPO | Admitting: Obstetrics and Gynecology

## 2015-03-07 ENCOUNTER — Encounter: Payer: Self-pay | Admitting: Obstetrics and Gynecology

## 2015-03-07 VITALS — BP 136/86 | HR 66 | Ht 70.5 in | Wt 201.0 lb

## 2015-03-07 DIAGNOSIS — N946 Dysmenorrhea, unspecified: Secondary | ICD-10-CM

## 2015-03-07 DIAGNOSIS — D259 Leiomyoma of uterus, unspecified: Secondary | ICD-10-CM

## 2015-03-07 NOTE — Progress Notes (Signed)
Subjective  49 y.o. G65P1011  African American female here for pelvic ultrasound for check of uterine fibroids.   Know history of fibroids.  Menses - heavy and clotting.  Heavy for 2 days only.  Menses are regular.  On OCPs - Ortho Novum 777.  (Also had BTL.) Nausea with Yasmin.  Takes Motrin 800 mg around the clock to control her pain.  Feels like labor pain, always right sided. Has pain in menses and radiates into her thigh.  Can feel the same pain when gassy.  Objective  Pelvic ultrasound images and report reviewed with patient.  Uterus - 4 fibroids.  0.68 - 1.65 cm.  Anterior, posterior and fundal.  None encroaching on the endometrium.  EMS - 3.25 mm.  Ovaries - normal. Free fluid - no     Assessment  Uterine fibroids.  Dysmenorrhea. Endometriosis? Status post BTL.  On triphasic OCPs.  Plan   Discussion of fibroids.  ACOG handout.  Discussion of treatment of fibroids and dysmenorrhea - options for continuous contraception (seasonale for example), Depo Provera, Mirena IUD, surgical evaluation and treatment.  Patient considering Mirena and Seasonale and will call back with choice.  Brochure on Mirena.    ___15____ minutes face to face time of which over 50% was spent in counseling.   After visit summary to patient.

## 2015-03-10 ENCOUNTER — Other Ambulatory Visit: Payer: Self-pay | Admitting: Certified Nurse Midwife

## 2015-03-12 NOTE — Telephone Encounter (Signed)
02/05/15 #3 packs/4 rfs was sent to [facility]/[address]- Denied.

## 2015-04-26 ENCOUNTER — Other Ambulatory Visit: Payer: Self-pay | Admitting: Nurse Practitioner

## 2015-04-26 NOTE — Telephone Encounter (Signed)
Medication refill request: Ibuprofen 800 mg  Last AEX:  02/05/15 with PG  Next AEX: 02/11/16 with PG  Last MMG (if hormonal medication request): n/a Refill authorized:  Please advise.

## 2016-01-03 DIAGNOSIS — K080 Exfoliation of teeth due to systemic causes: Secondary | ICD-10-CM | POA: Diagnosis not present

## 2016-02-11 ENCOUNTER — Encounter: Payer: Self-pay | Admitting: Nurse Practitioner

## 2016-02-11 ENCOUNTER — Other Ambulatory Visit: Payer: Self-pay | Admitting: Nurse Practitioner

## 2016-02-11 ENCOUNTER — Ambulatory Visit (INDEPENDENT_AMBULATORY_CARE_PROVIDER_SITE_OTHER): Payer: Federal, State, Local not specified - PPO | Admitting: Nurse Practitioner

## 2016-02-11 VITALS — BP 140/88 | HR 58 | Resp 12 | Ht 70.5 in | Wt 195.2 lb

## 2016-02-11 DIAGNOSIS — Z0000 Encounter for general adult medical examination without abnormal findings: Secondary | ICD-10-CM

## 2016-02-11 DIAGNOSIS — Z01419 Encounter for gynecological examination (general) (routine) without abnormal findings: Secondary | ICD-10-CM

## 2016-02-11 LAB — POCT URINALYSIS DIPSTICK
BILIRUBIN UA: NEGATIVE
Blood, UA: NEGATIVE
Glucose, UA: NEGATIVE
KETONES UA: NEGATIVE
LEUKOCYTES UA: NEGATIVE
Nitrite, UA: NEGATIVE
PH UA: 5
Protein, UA: NEGATIVE
Urobilinogen, UA: NEGATIVE

## 2016-02-11 LAB — CBC
HCT: 40.6 % (ref 35.0–45.0)
Hemoglobin: 13 g/dL (ref 11.7–15.5)
MCH: 27.4 pg (ref 27.0–33.0)
MCHC: 32 g/dL (ref 32.0–36.0)
MCV: 85.5 fL (ref 80.0–100.0)
MPV: 10.4 fL (ref 7.5–12.5)
PLATELETS: 270 10*3/uL (ref 140–400)
RBC: 4.75 MIL/uL (ref 3.80–5.10)
RDW: 14.8 % (ref 11.0–15.0)
WBC: 5.3 10*3/uL (ref 3.8–10.8)

## 2016-02-11 LAB — COMPREHENSIVE METABOLIC PANEL
ALBUMIN: 3.7 g/dL (ref 3.6–5.1)
ALT: 7 U/L (ref 6–29)
AST: 13 U/L (ref 10–35)
Alkaline Phosphatase: 37 U/L (ref 33–115)
BUN: 10 mg/dL (ref 7–25)
CALCIUM: 8.6 mg/dL (ref 8.6–10.2)
CO2: 23 mmol/L (ref 20–31)
Chloride: 106 mmol/L (ref 98–110)
Creat: 0.81 mg/dL (ref 0.50–1.10)
Glucose, Bld: 87 mg/dL (ref 65–99)
POTASSIUM: 4.1 mmol/L (ref 3.5–5.3)
Sodium: 138 mmol/L (ref 135–146)
Total Bilirubin: 0.4 mg/dL (ref 0.2–1.2)
Total Protein: 7.4 g/dL (ref 6.1–8.1)

## 2016-02-11 LAB — LIPID PANEL
CHOL/HDL RATIO: 3.2 ratio (ref <=5.0)
Cholesterol: 234 mg/dL — ABNORMAL HIGH (ref 125–200)
HDL: 74 mg/dL (ref >=46)
LDL CALC: 138 mg/dL — AB (ref <130)
Triglycerides: 112 mg/dL (ref <150)
VLDL: 22 mg/dL (ref <30)

## 2016-02-11 LAB — TSH: TSH: 1.32 m[IU]/L

## 2016-02-11 MED ORDER — VALACYCLOVIR HCL 1 G PO TABS: 1000.0000 mg | ORAL_TABLET | Freq: Every day | ORAL | Status: DC

## 2016-02-11 MED ORDER — IBUPROFEN 800 MG PO TABS: 800.0000 mg | ORAL_TABLET | Freq: Three times a day (TID) | ORAL | Status: DC | PRN

## 2016-02-11 MED ORDER — NORETHIN-ETH ESTRAD TRIPHASIC 0.5/0.75/1-35 MG-MCG PO TABS: 1.0000 | ORAL_TABLET | Freq: Every day | ORAL | Status: DC

## 2016-02-11 NOTE — Progress Notes (Signed)
50 y.o. [id number] Single  African American Fe here for annual exam. Pt would like to discuss headaches with menses.    Menses now at 4 days.  Some cramps.  Now HA's at 5-10 days - before, during or after a cycle.  These HA are relieved with Motrin and denies visual changes or aura.   Not dating/ SA.  She does complain that she is having some memory issues and while reading has to re read some thing's.  Does not relate this as associated with menses.  She has stepped up to a new job and is learning new things and maybe a little more stress.  She has moved from apt in [place] to a house in [place].  Her sister remains close by.  Patient's last menstrual period was 01/15/2016 (approximate).          Sexually active: No.  The current method of family planning is OCP (estrogen/progesterone).    Exercising: Yes.    Cardio, weights Smoker:  no  Health Maintenance: Pap:  02/05/15-WNL neg HR HPV MMG:  12/27/14-BIRADS1-neg TDaP:  11/2012 HIV: A long time ago Labs: done today   reports that she has never smoked. She has never used smokeless tobacco. She reports that she drinks about 0.6 oz of alcohol per week. She reports that she does not use illicit drugs.  Past Medical History  Diagnosis Date  . Back pain   . Constipation   . Hemorrhoids   . Ovarian cyst     Past Surgical History  Procedure Laterality Date  . 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 Tonsillectomy  as child    Current Outpatient Prescriptions  Medication Sig Dispense Refill  . ibuprofen (ADVIL,MOTRIN) 800 MG tablet Take 1 tablet (800 mg total) by mouth every 8 (eight) hours as needed. 30 tablet 6  . norethindrone-ethinyl estradiol (NECON 7/7/7) 0.5/0.75/1-35 MG-MCG tablet Take 1 tablet by mouth daily. 3 Package 4  . valACYclovir (VALTREX) 1000 MG tablet Take 1 tablet (1,000 mg total) by mouth daily. 90 tablet 3   No current facility-administered medications for this visit.     Family History  Problem Relation Age of Onset  . Heart attack Maternal Grandfather     ROS:  Pertinent items are noted in HPI.  Otherwise, a comprehensive ROS was negative.  Exam:   BP 140/88 mmHg  Pulse 58  Resp 12  Ht 5' 10.5" (1.791 m)  Wt 195 lb 3.2 oz (88.542 kg)  BMI 27.60 kg/m2  LMP 01/15/2016 (Approximate) Height: 5' 10.5" (179.1 cm) Ht Readings from Last 3 Encounters:  02/11/16 5' 10.5" (1.791 m)  03/07/15 5' 10.5" (1.791 m)  02/05/15 5' 10.5" (1.791 m)    General appearance: alert, cooperative and appears stated age Head: Normocephalic, without obvious abnormality, atraumatic Neck: no adenopathy, supple, symmetrical, trachea midline and thyroid normal to inspection and palpation Lungs: clear to auscultation bilaterally Breasts: normal appearance, no masses or tenderness Heart: regular rate and rhythm Abdomen: soft, non-tender; no masses,  no organomegaly Extremities: extremities normal, atraumatic, no cyanosis or edema Skin: Skin color, texture, turgor normal. No rashes or lesions Lymph nodes: Cervical, supraclavicular, and axillary nodes normal. No abnormal inguinal nodes palpated Neurologic: Grossly normal   Pelvic: External genitalia:  no lesions              Urethra:  normal appearing urethra with no masses, tenderness or lesions  Bartholin's and Skene's: normal                 Vagina: normal appearing vagina with normal color and discharge, no lesions              Cervix: anteverted              Pap taken: No. Bimanual Exam:  Uterus:  normal size, contour, position, consistency, mobility, non-tender              Adnexa: no mass, fullness, tenderness               Rectovaginal: Confirms               Anus:  normal sphincter tone, no lesions  Chaperone present: no  A:  Well Woman with normal exam  History of uterine fibroids with pelvic pressure and dysmenorrhea History of HSV Fatigue and palpitations with work  out - cardio eval this past yr was normal  HA's with menses  P:   Reviewed health and wellness pertinent to exam  Pap smear as above  Mammogram is due now and will schedule  Refill on Ortho Novum 777 for a year  Will follow with labs  If her HA's or memory issues change she will seek care neurologist in [place]   Suggested a change of OCP to provide with a monophasic dosing or a continuous OCP to help with HA's - she declines since they are not as bad.  She will consider if this persist.  Counseled on breast self exam, mammography screening, use and side effects of OCP's, adequate intake of calcium and vitamin D, diet and exercise, Kegel's exercises return annually or prn  An After Visit Summary was printed and given to the patient.

## 2016-02-11 NOTE — Patient Instructions (Signed)

## 2016-02-11 NOTE — Telephone Encounter (Signed)
Medication refill request: Ibuprofen 800mg  (Pateint requesting 90 days ) Last AEX:  today Last MMG (if hormonal medication request): 02-21-15 Refill authorized: please advise

## 2016-02-12 ENCOUNTER — Other Ambulatory Visit: Payer: Self-pay | Admitting: *Deleted

## 2016-02-12 ENCOUNTER — Other Ambulatory Visit: Payer: Self-pay | Admitting: Nurse Practitioner

## 2016-02-12 LAB — HIV ANTIBODY (ROUTINE TESTING W REFLEX): HIV 1&2 Ab, 4th Generation: NONREACTIVE

## 2016-02-12 LAB — VITAMIN D 25 HYDROXY (VIT D DEFICIENCY, FRACTURES): Vit D, 25-Hydroxy: 18 ng/mL — ABNORMAL LOW (ref 30–100)

## 2016-02-12 MED ORDER — VITAMIN D (ERGOCALCIFEROL) 1.25 MG (50000 UNIT) PO CAPS: 50000.0000 [IU] | ORAL_CAPSULE | ORAL | Status: DC

## 2016-02-12 NOTE — Progress Notes (Signed)
Encounter reviewed Could consider a 3 month pill, loseasonique, to see if that helps her menstrual headaches.  Sumner Boast, MD

## 2016-02-18 ENCOUNTER — Other Ambulatory Visit: Payer: Self-pay | Admitting: Certified Nurse Midwife

## 2016-02-26 ENCOUNTER — Telehealth: Payer: Self-pay | Admitting: Nurse Practitioner

## 2016-02-26 MED ORDER — FLUCONAZOLE 150 MG PO TABS: ORAL_TABLET | ORAL | Status: DC

## 2016-02-26 NOTE — Telephone Encounter (Signed)
Patient has an yeast infection and is requesting diflucan to be sent into [facility] @ [address]. [phone]. Patient is aware we do not treat over the phone.

## 2016-02-26 NOTE — Telephone Encounter (Signed)
Prescription placed and patient agreeable. She expresses appreciation for this to Kem Boroughs, FNP.  Instructions given and patient verbalized understanding. Will call back for office visit if symptoms continue or any concerns.  Routing to provider for final review. Patient agreeable to disposition. Will close encounter.

## 2016-02-26 NOTE — Telephone Encounter (Signed)
Return call to patient. She states she is experiencing two days of white thick cottage cheese type discharge and vaginal itching that has just started. She states she works nights and has to be at work at [employer] otherwise would take OTC treatment but cannot take nighttime suppository at this time. She requests Diflucan and asks if Kem Boroughs, FNP can make exception at this time because she is in [place].  Advised will send message to provider and return call. Patient agreeable.

## 2016-02-26 NOTE — Telephone Encounter (Signed)
Yes she may have Diflucan 150 mg X 2 doses.  I just saw her for AEX about 2 weeks ago.

## 2016-09-22 ENCOUNTER — Other Ambulatory Visit: Payer: Self-pay | Admitting: Nurse Practitioner

## 2016-09-25 NOTE — Telephone Encounter (Signed)
Medication refill request: Vitamin D 50000 Last AEX:  02/11/16 PG Next AEX: none scheduled Last MMG (if hormonal medication request):  02/05/15-WNL neg HR HPV Refill authorized: 02/12/16 #12 w/0 refills; today please advise - Last Vit D was 18 on 02/11/16

## 2016-10-09 DIAGNOSIS — H43393 Other vitreous opacities, bilateral: Secondary | ICD-10-CM | POA: Diagnosis not present

## 2017-02-26 ENCOUNTER — Ambulatory Visit: Payer: Federal, State, Local not specified - PPO | Admitting: Nurse Practitioner

## 2017-03-05 ENCOUNTER — Encounter: Payer: Self-pay | Admitting: Nurse Practitioner

## 2017-03-05 ENCOUNTER — Ambulatory Visit (INDEPENDENT_AMBULATORY_CARE_PROVIDER_SITE_OTHER): Payer: Federal, State, Local not specified - PPO | Admitting: Nurse Practitioner

## 2017-03-05 VITALS — BP 134/92 | HR 88 | Ht 70.25 in | Wt 194.0 lb

## 2017-03-05 DIAGNOSIS — N760 Acute vaginitis: Secondary | ICD-10-CM | POA: Diagnosis not present

## 2017-03-05 DIAGNOSIS — Z0000 Encounter for general adult medical examination without abnormal findings: Secondary | ICD-10-CM | POA: Diagnosis not present

## 2017-03-05 DIAGNOSIS — N946 Dysmenorrhea, unspecified: Secondary | ICD-10-CM | POA: Diagnosis not present

## 2017-03-05 DIAGNOSIS — Z01411 Encounter for gynecological examination (general) (routine) with abnormal findings: Secondary | ICD-10-CM

## 2017-03-05 DIAGNOSIS — D259 Leiomyoma of uterus, unspecified: Secondary | ICD-10-CM

## 2017-03-05 MED ORDER — NORETHIN-ETH ESTRAD TRIPHASIC 0.5/0.75/1-35 MG-MCG PO TABS: 1.0000 | ORAL_TABLET | Freq: Every day | ORAL | 4 refills | Status: AC

## 2017-03-05 MED ORDER — VALACYCLOVIR HCL 1 G PO TABS: 1000.0000 mg | ORAL_TABLET | Freq: Every day | ORAL | 3 refills | Status: AC

## 2017-03-05 MED ORDER — VITAMIN D (ERGOCALCIFEROL) 1.25 MG (50000 UNIT) PO CAPS: 50000.0000 [IU] | ORAL_CAPSULE | ORAL | 2 refills | Status: AC

## 2017-03-05 NOTE — Progress Notes (Signed)
Encounter reviewed by Dr. Ayvin Lipinski Amundson C. Silva.  

## 2017-03-05 NOTE — Progress Notes (Signed)
Patient ID: Brooke West, female   DOB: 09-Nov-1965, 51 y.o.   MRN: [id number]  51 y.o. G2P1011 Single  African American Fe here for annual exam.  Menses is regular.  This past year very little dysmenorrhea.   She has PUS 03/07/15 for a recheck on fibroids.  They were stable - because of concerns of endometriosis as the cause of dysmenorrhea; she was given the option of changing to continuous active pill such as Seasonique or Lo Seasonique.  She did not want to change because of comfort level of being on this same pill for so long.  Maybe some increase discharge past few days - not sure if yeast or related to heat.  She is not dating or SA.  She is having some changes with work and feels like she wants to change after 20 yrs.  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  Wants to get this up and going.    She continues to live in [place].  Patient's last menstrual period was 02/07/2017 (exact date).          Sexually active: No.  The current method of family planning is S/P BTL - on OCP for dysmenorrhea  Exercising: Yes.    cardio and weights Smoker:  no  Health Maintenance: Pap: 02/05/15, Negative with neg HR HPV  11/19/11, Negative with neg HR HPV History of Abnormal Pap: years ago, unsure diagnosis MMG: 12/27/14, 3D-no, Bi-Rads 1:  Negative Self Breast exams: yes TDaP: 11/21/12 HIV: 02/11/16 Labs: Fasting labs today   reports that she has never smoked. She has never used smokeless tobacco. She reports that she drinks about 0.6 oz of alcohol per week . She reports that she does not use drugs.  Past Medical History:  Diagnosis Date  . Back pain   . Constipation   . Hemorrhoids   . Ovarian cyst     Past Surgical History:  Procedure Laterality Date  . 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 TONSILLECTOMY  as child    Current Outpatient Prescriptions  Medication Sig Dispense Refill  . ibuprofen (ADVIL,MOTRIN) 800 MG tablet  TAKE 1 TABLET(800 MG) BY MOUTH EVERY 8 HOURS AS NEEDED 90 tablet 0  . norethindrone-ethinyl estradiol (NECON 7/7/7) 0.5/0.75/1-35 MG-MCG tablet Take 1 tablet by mouth daily. 3 Package 4  . valACYclovir (VALTREX) 1000 MG tablet Take 1 tablet (1,000 mg total) by mouth daily. 90 tablet 3   No current facility-administered medications for this visit.     Family History  Problem Relation Age of Onset  . Heart attack Maternal Grandfather     ROS:  Pertinent items are noted in HPI.  Otherwise, a comprehensive ROS was negative.  Exam:   BP (!) 134/92 (BP Location: Right Arm, Patient Position: Sitting, Cuff Size: Large)   Pulse 88   Ht 5' 10.25" (1.784 m)   Wt 194 lb (88 kg)   LMP 02/07/2017 (Exact Date)   BMI 27.64 kg/m  Height: 5' 10.25" (178.4 cm) Ht Readings from Last 3 Encounters:  03/05/17 5' 10.25" (1.784 m)  02/11/16 5' 10.5" (1.791 m)  03/07/15 5' 10.5" (1.791 m)    General appearance: alert, cooperative and appears stated age Head: Normocephalic, without obvious abnormality, atraumatic Neck: no adenopathy, supple, symmetrical, trachea midline and thyroid normal to inspection and palpation Lungs: clear to auscultation bilaterally Breasts: normal appearance, no masses or tenderness Heart: regular rate and  rhythm Abdomen: soft, non-tender; no masses,  no organomegaly Extremities: extremities normal, atraumatic, no cyanosis or edema Skin: Skin color, texture, turgor normal. No rashes or lesions Lymph nodes: Cervical, supraclavicular, and axillary nodes normal. No abnormal inguinal nodes palpated Neurologic: Grossly normal   Pelvic: External genitalia:  no lesions              Urethra:  normal appearing urethra with no masses, tenderness or lesions              Bartholin's and Skene's: normal                 Vagina: normal appearing vagina with normal color and discharge, no lesions              Cervix: anteverted              Pap taken: No. Bimanual Exam:  Uterus:  normal  size, contour, position, consistency, mobility, non-tender              Adnexa: no mass, fullness, tenderness               Rectovaginal: Confirms               Anus:  normal sphincter tone, no lesions  Chaperone present: yes  A:  Well Woman with normal exam  S/P BTL - OCP for dysmenorrhea  History of UTE fibroids  History of HSV  History of menstrual HA's   P:   Reviewed health and wellness pertinent to exam  Pap smear: no  Mammogram is past due and she is made apt while here to get done  Follow up with labs and Affirm  Refill on ON 777 for a year  Refill on Vit D and will follow with labs  Counseled on breast self exam, mammography screening, use and side effects of OCP's, adequate intake of calcium and vitamin D, diet and exercise return annually or prn  An After Visit Summary was printed and given to the patient.

## 2017-03-05 NOTE — Progress Notes (Signed)
Spoke with Mickel Baas at [facility] scheduling [phone]. Patient scheduled for screening MMG on 03/19/17 at 11am. No order needed, will send report to [phone]. Patient verbalizes understanding and is agreeable.

## 2017-03-05 NOTE — Patient Instructions (Addendum)

## 2017-03-06 LAB — VAGINITIS/VAGINOSIS, DNA PROBE
CANDIDA SPECIES: NEGATIVE
GARDNERELLA VAGINALIS: NEGATIVE
Trichomonas vaginosis: NEGATIVE

## 2017-03-06 LAB — LIPID PANEL
CHOL/HDL RATIO: 2.9 ratio (ref 0.0–4.4)
Cholesterol, Total: 200 mg/dL — ABNORMAL HIGH (ref 100–199)
HDL: 68 mg/dL (ref >39)
LDL Calculated: 115 mg/dL — ABNORMAL HIGH (ref 0–99)
Triglycerides: 87 mg/dL (ref 0–149)
VLDL CHOLESTEROL CAL: 17 mg/dL (ref 5–40)

## 2017-03-06 LAB — COMPREHENSIVE METABOLIC PANEL
A/G RATIO: 1.1 — AB (ref 1.2–2.2)
ALK PHOS: 43 IU/L (ref 39–117)
ALT: 7 IU/L (ref 0–32)
AST: 15 IU/L (ref 0–40)
Albumin: 3.9 g/dL (ref 3.5–5.5)
BUN / CREAT RATIO: 6 — AB (ref 9–23)
BUN: 5 mg/dL — ABNORMAL LOW (ref 6–24)
Bilirubin Total: 0.5 mg/dL (ref 0.0–1.2)
CO2: 22 mmol/L (ref 20–29)
Calcium: 8.6 mg/dL — ABNORMAL LOW (ref 8.7–10.2)
Chloride: 105 mmol/L (ref 96–106)
Creatinine, Ser: 0.84 mg/dL (ref 0.57–1.00)
GFR calc Af Amer: 94 mL/min/{1.73_m2} (ref >59)
GFR calc non Af Amer: 82 mL/min/{1.73_m2} (ref >59)
GLOBULIN, TOTAL: 3.6 g/dL (ref 1.5–4.5)
Glucose: 85 mg/dL (ref 65–99)
POTASSIUM: 4.4 mmol/L (ref 3.5–5.2)
SODIUM: 140 mmol/L (ref 134–144)
Total Protein: 7.5 g/dL (ref 6.0–8.5)

## 2017-03-06 LAB — CBC
HEMATOCRIT: 40.7 % (ref 34.0–46.6)
Hemoglobin: 13 g/dL (ref 11.1–15.9)
MCH: 27.9 pg (ref 26.6–33.0)
MCHC: 31.9 g/dL (ref 31.5–35.7)
MCV: 87 fL (ref 79–97)
Platelets: 276 10*3/uL (ref 150–379)
RBC: 4.66 x10E6/uL (ref 3.77–5.28)
RDW: 16 % — AB (ref 12.3–15.4)
WBC: 5.4 10*3/uL (ref 3.4–10.8)

## 2017-03-06 LAB — TSH: TSH: 2.15 u[IU]/mL (ref 0.450–4.500)

## 2017-03-06 LAB — VITAMIN D 25 HYDROXY (VIT D DEFICIENCY, FRACTURES): VIT D 25 HYDROXY: 30.3 ng/mL (ref 30.0–100.0)

## 2017-06-07 ENCOUNTER — Other Ambulatory Visit: Payer: Self-pay

## 2017-06-07 NOTE — Telephone Encounter (Signed)
Medication refill request: Ibuprofen 800mg  Last AEX:  03/05/17 PG Next AEX: not scheduled -- will call to schedule for next year Last MMG (if hormonal medication request): 03/19/17 at [facility] -- will get report Refill authorized: 02/18/16 #90 w/0 refills; today please advise

## 2017-06-10 ENCOUNTER — Other Ambulatory Visit: Payer: Self-pay | Admitting: Obstetrics & Gynecology

## 2017-06-10 MED ORDER — IBUPROFEN 800 MG PO TABS: ORAL_TABLET | ORAL | 0 refills | Status: AC

## 2017-06-22 DIAGNOSIS — K080 Exfoliation of teeth due to systemic causes: Secondary | ICD-10-CM | POA: Diagnosis not present

## 2018-07-25 DIAGNOSIS — R350 Frequency of micturition: Secondary | ICD-10-CM | POA: Diagnosis not present

## 2018-07-25 DIAGNOSIS — N951 Menopausal and female climacteric states: Secondary | ICD-10-CM | POA: Diagnosis not present

## 2018-07-25 DIAGNOSIS — Z13220 Encounter for screening for lipoid disorders: Secondary | ICD-10-CM | POA: Diagnosis not present

## 2018-07-25 DIAGNOSIS — Z131 Encounter for screening for diabetes mellitus: Secondary | ICD-10-CM | POA: Diagnosis not present

## 2018-07-25 DIAGNOSIS — Z01419 Encounter for gynecological examination (general) (routine) without abnormal findings: Secondary | ICD-10-CM | POA: Diagnosis not present

## 2018-07-25 DIAGNOSIS — N920 Excessive and frequent menstruation with regular cycle: Secondary | ICD-10-CM | POA: Diagnosis not present

## 2018-07-27 DIAGNOSIS — N6001 Solitary cyst of right breast: Secondary | ICD-10-CM | POA: Diagnosis not present

## 2018-07-27 DIAGNOSIS — N6002 Solitary cyst of left breast: Secondary | ICD-10-CM | POA: Diagnosis not present

## 2018-07-27 DIAGNOSIS — R921 Mammographic calcification found on diagnostic imaging of breast: Secondary | ICD-10-CM | POA: Diagnosis not present

## 2018-07-29 DIAGNOSIS — M8589 Other specified disorders of bone density and structure, multiple sites: Secondary | ICD-10-CM | POA: Diagnosis not present

## 2018-08-09 DIAGNOSIS — R030 Elevated blood-pressure reading, without diagnosis of hypertension: Secondary | ICD-10-CM | POA: Diagnosis not present

## 2018-08-09 DIAGNOSIS — R002 Palpitations: Secondary | ICD-10-CM | POA: Diagnosis not present

## 2018-08-19 DIAGNOSIS — R079 Chest pain, unspecified: Secondary | ICD-10-CM | POA: Diagnosis not present

## 2018-12-23 DIAGNOSIS — N920 Excessive and frequent menstruation with regular cycle: Secondary | ICD-10-CM | POA: Diagnosis not present

## 2018-12-23 DIAGNOSIS — N945 Secondary dysmenorrhea: Secondary | ICD-10-CM | POA: Diagnosis not present

## 2018-12-23 DIAGNOSIS — D251 Intramural leiomyoma of uterus: Secondary | ICD-10-CM | POA: Diagnosis not present

## 2018-12-26 DIAGNOSIS — N920 Excessive and frequent menstruation with regular cycle: Secondary | ICD-10-CM | POA: Diagnosis not present

## 2018-12-26 DIAGNOSIS — D251 Intramural leiomyoma of uterus: Secondary | ICD-10-CM | POA: Diagnosis not present

## 2018-12-26 DIAGNOSIS — N945 Secondary dysmenorrhea: Secondary | ICD-10-CM | POA: Diagnosis not present

## 2018-12-30 DIAGNOSIS — N920 Excessive and frequent menstruation with regular cycle: Secondary | ICD-10-CM | POA: Diagnosis not present

## 2018-12-30 DIAGNOSIS — N841 Polyp of cervix uteri: Secondary | ICD-10-CM | POA: Diagnosis not present

## 2018-12-30 DIAGNOSIS — I10 Essential (primary) hypertension: Secondary | ICD-10-CM | POA: Diagnosis not present

## 2019-01-27 DIAGNOSIS — N945 Secondary dysmenorrhea: Secondary | ICD-10-CM | POA: Diagnosis not present

## 2019-01-27 DIAGNOSIS — N920 Excessive and frequent menstruation with regular cycle: Secondary | ICD-10-CM | POA: Diagnosis not present

## 2019-03-03 DIAGNOSIS — I10 Essential (primary) hypertension: Secondary | ICD-10-CM | POA: Diagnosis not present

## 2019-07-25 DIAGNOSIS — R1310 Dysphagia, unspecified: Secondary | ICD-10-CM | POA: Diagnosis not present

## 2019-07-25 DIAGNOSIS — K219 Gastro-esophageal reflux disease without esophagitis: Secondary | ICD-10-CM | POA: Diagnosis not present
